# Patient Record
Sex: Female | Born: 1955 | Race: White | Hispanic: No
[De-identification: ages and names within clinical notes are randomized; demographics above are authoritative.]

---

## 2020-07-22 ENCOUNTER — Encounter

## 2020-07-22 DIAGNOSIS — R51 Headache: Secondary | ICD-10-CM

## 2020-07-22 DIAGNOSIS — M810 Age-related osteoporosis without current pathological fracture: Secondary | ICD-10-CM

## 2020-07-22 DIAGNOSIS — M069 Rheumatoid arthritis, unspecified: Secondary | ICD-10-CM

## 2020-07-22 DIAGNOSIS — M545 Low back pain: Secondary | ICD-10-CM

## 2022-04-01 IMAGING — MR MRI RIGHT TIB-FIB WITHOUT CONTRAST
4 of 6 series · 19 of 40 positions shown · IV contrast (gadolinium)
Comparison: 02/06/2022 [facility] radiographs

________________________________________________________________________________________________ 
MRI RIGHT TIB-FIB WITHOUT CONTRAST, 04/01/2022 [DATE]: 
CLINICAL INDICATION: Stress fracture, right tibia. Patient is a dancer.
TECHNIQUE: Multiplanar, multiecho position MR images of the right calf were 
performed without intravenous gadolinium enhancement. The left calf was included 
on coronal imaging. Patient was scanned on a 1.5T magnet.

[Series 301: survey right · axial · right · 10.0mm · 1.05mm/px · z∈[+5,+255]mm · 3 of 12 slices shown]
[im 1/12]
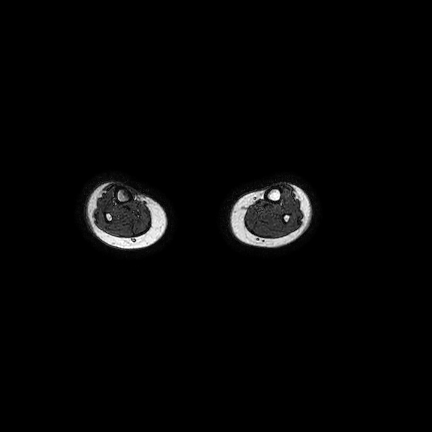
[im 6/12]
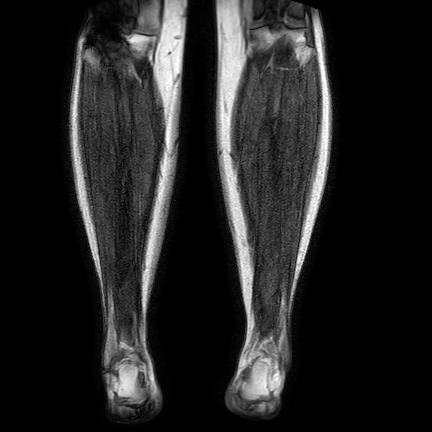
[im 12/12]
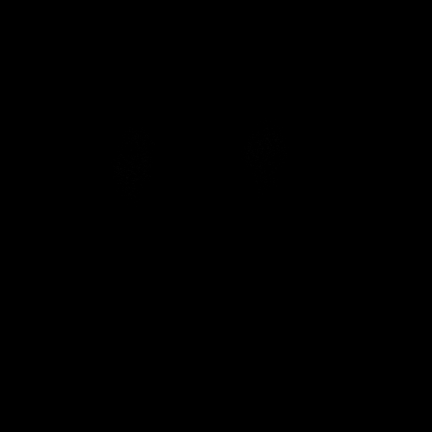

[Series 402: stir_cor bilat · coronal · right · 5.0mm · 0.81mm/px · 5 of 30 slices shown]
[im 1/30]
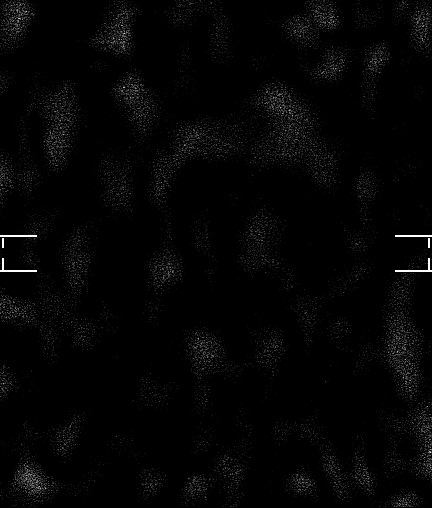
[im 8/30]
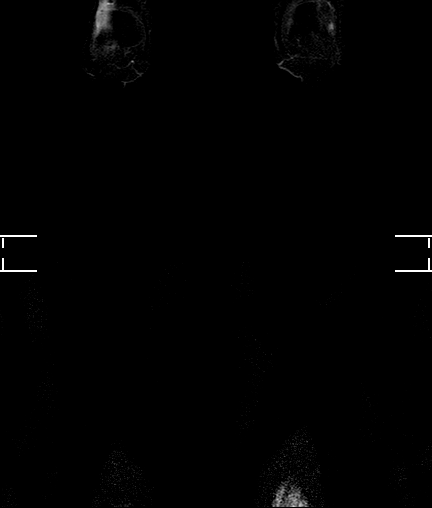
[im 15/30]
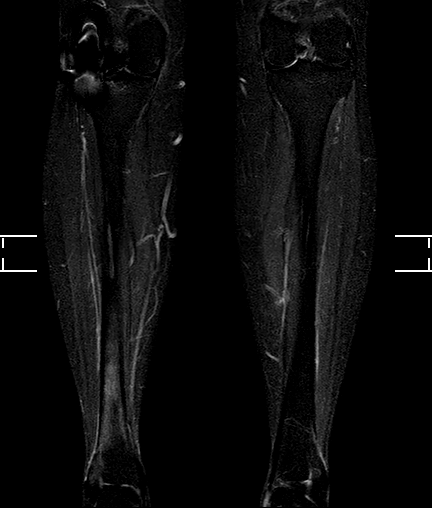
[im 22/30]
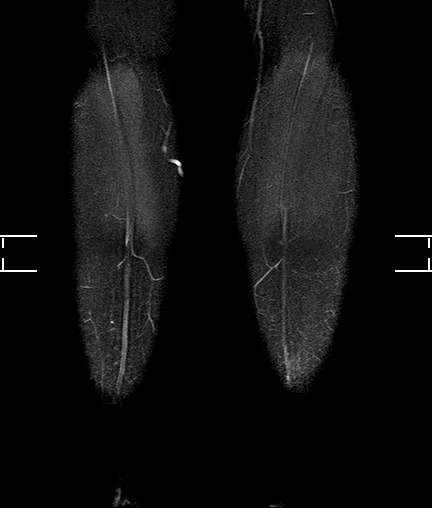
[im 30/30]
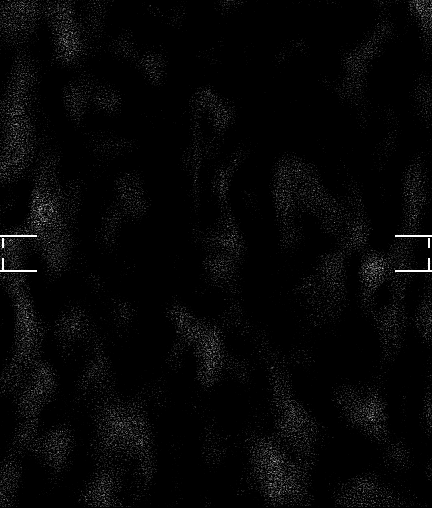

[Series 502: mobiview stir_sag · sagittal · right · 5.0mm · 0.51mm/px · 4 of 23 slices shown]
[im 1/23]
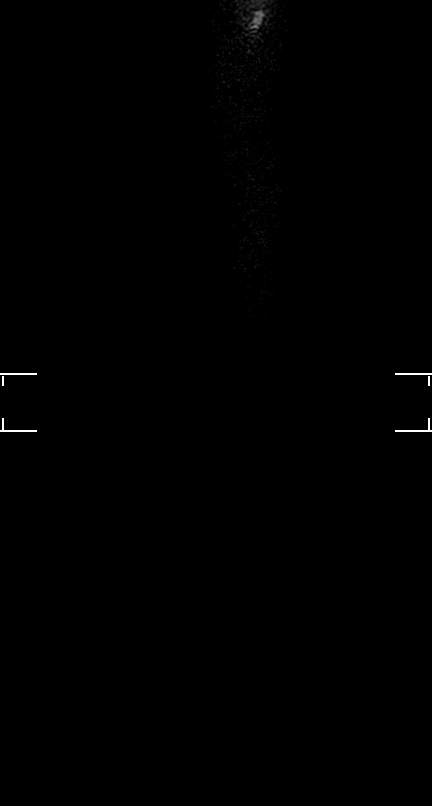
[im 8/23]
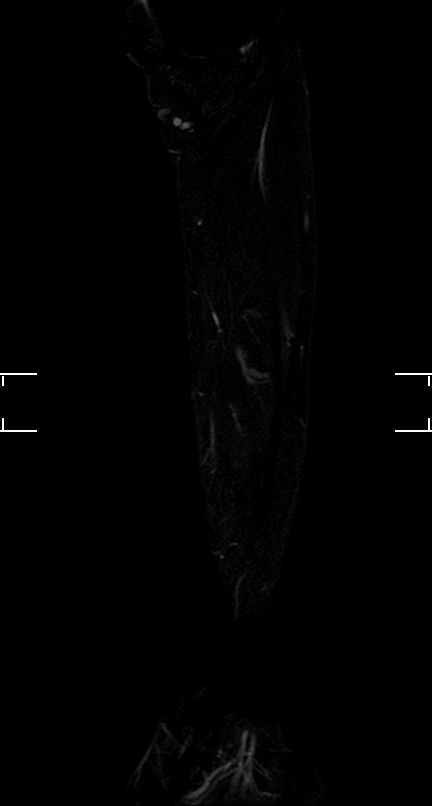
[im 15/23]
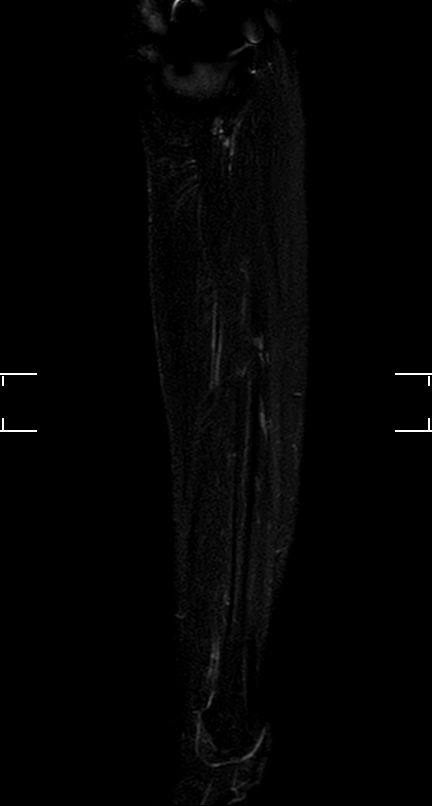
[im 23/23]
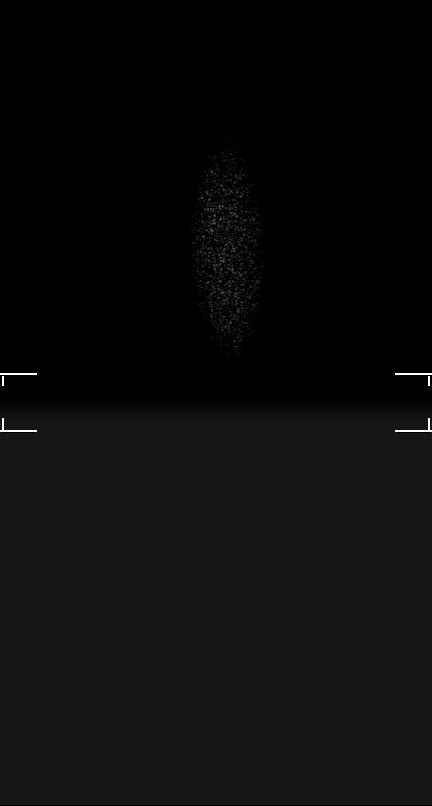

[Series 601: t1w_ax right · axial · right · 5.5mm · 0.49mm/px · z∈[-147,+175]mm · 7 of 52 slices shown]
[im 1/52]
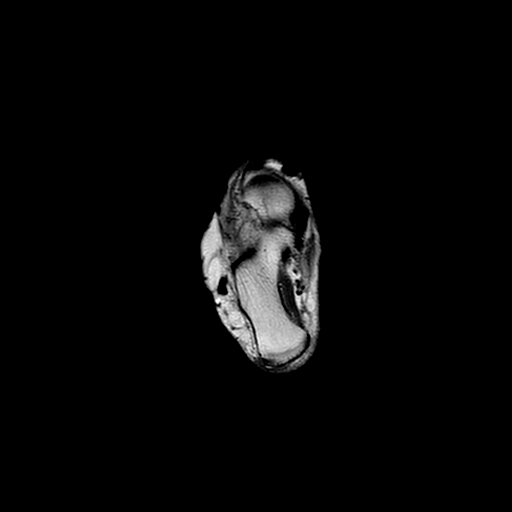
[im 8/52]
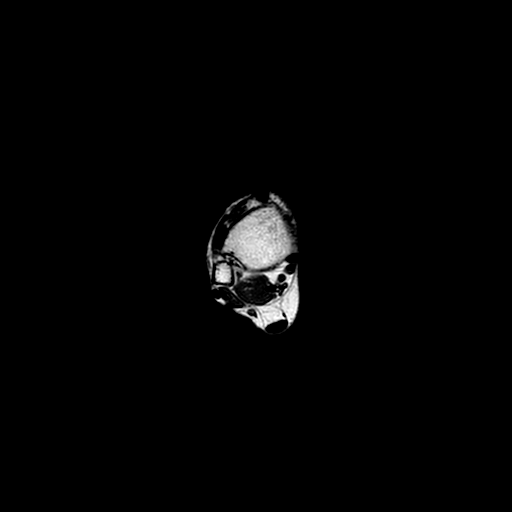
[im 15/52]
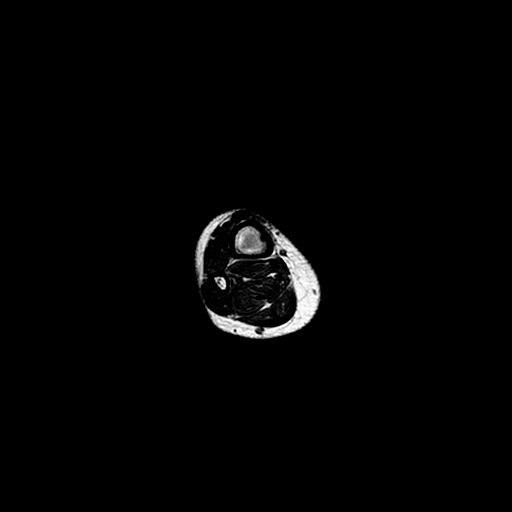
[im 22/52]
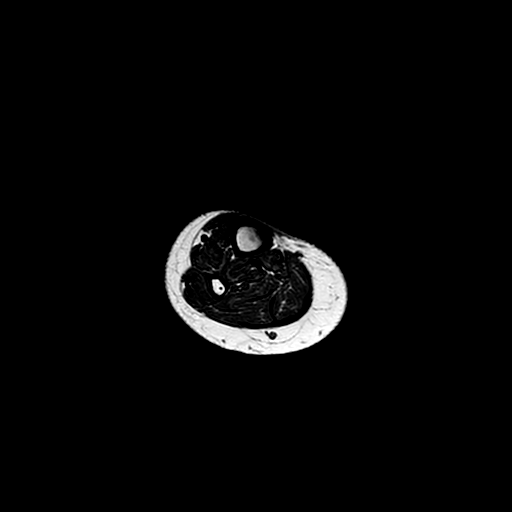
[im 30/52]
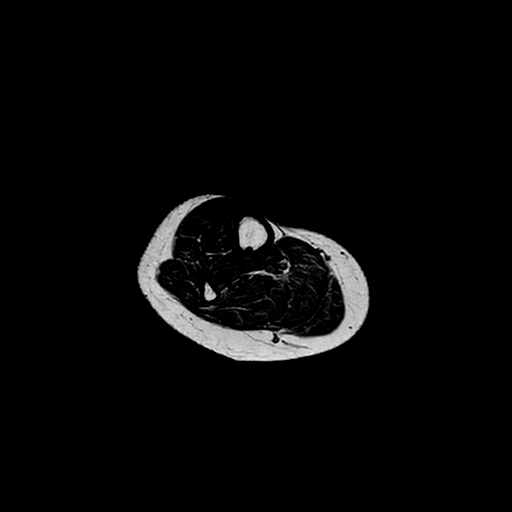
[im 37/52]
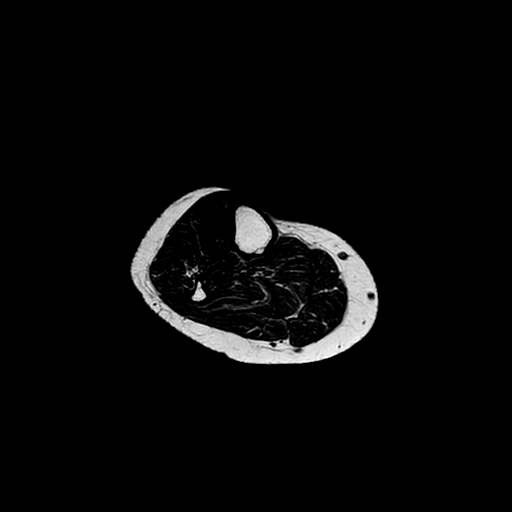
[im 44/52]
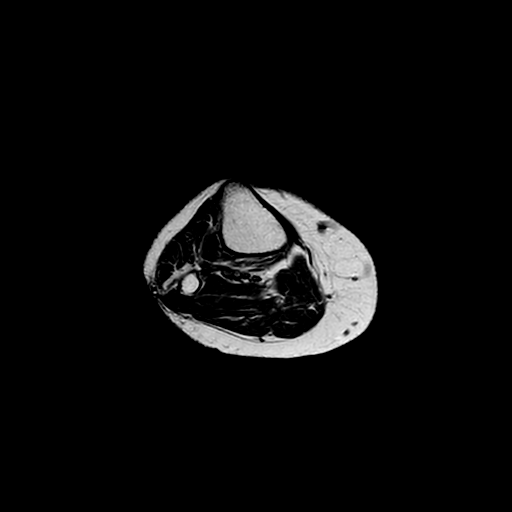

[19 of 40 positions shown; findings below may reference images not displayed]

FINDINGS: TIBIA/FIBULA: Subacute nondisplaced, incomplete right distal medial tibial 
stress fracture measures 6.7 cm in length, with the epicenter located 8.8 cm 
proximal to the tibial plafond. The fracture extends into the peripheral 
medullary canal and surrounding marrow edema measures 12.5 cm in length 
(Arismendy classification 4b). Medial/anteromedial distal tibial periosteal 
reaction. 
JOINTS: Right lateral knee compartment hemiarthroplasty. Ankle joint is 
preserved. No focal erosion. No subchondral marrow edema. Normal alignment. No 
joint effusion. No discrete synovitis.  
TENDONS: Intact. 
SOFT TISSUES: Distal right pretibial subcutaneous soft tissue swelling. Small 
superficial varicosities. Normal muscle signal intensity, without atrophy. No 
mass or fluid collections.
IMPRESSION: 1.  Subacute nondisplaced, incomplete right distal medial tibial stress fracture 
measures 6.7 cm in length (Arismendy classification 4b). 
2.  Right lateral knee compartment hemiarthroplasty.

## 2022-06-27 IMAGING — MR MRI RIGHT HIP WITHOUT CONTRAST
4 of 7 series · 16 of 40 positions shown · IV contrast (gadolinium)
Comparison: None.

________________________________________________________________________________________________ 
MRI RIGHT HIP WITHOUT CONTRAST, 06/27/2022 [DATE]: 
CLINICAL INDICATION: Unilateral primary osteoarthritis, right hip.
TECHNIQUE: Multiplanar, multiecho position MR images of the pelvis and right hip 
were performed without intravenous gadolinium enhancement. Small field-of-view 
imaging was performed of the hip.

[Series 101: survey_fullfov_transversal · axial · 10.0mm · 1.66mm/px · z∈[-51,+51]mm · 2 of 7 slices shown]
[im 1/7]
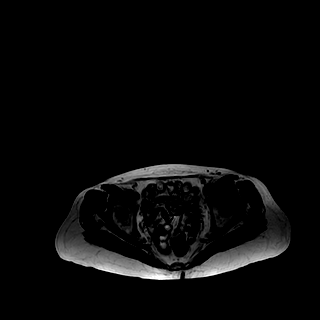
[im 7/7]
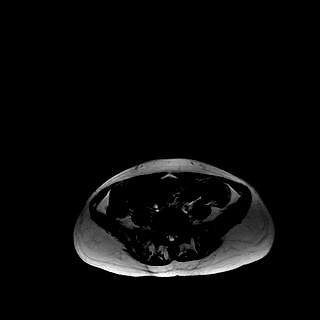

[Series 201: survey · axial · 15.0mm · 1.76mm/px · z∈[+20,+263]mm · 3 of 14 slices shown]
[im 1/14]
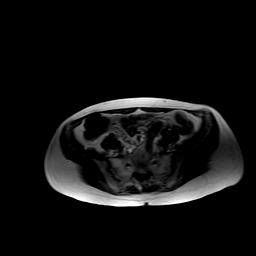
[im 7/14]
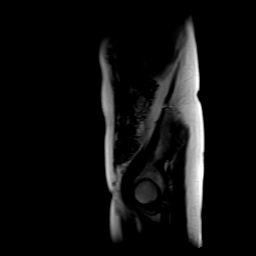
[im 14/14]
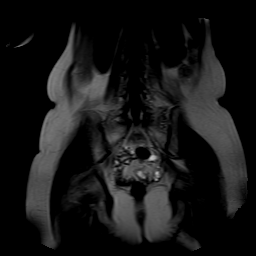

[Series 301: stir_cor-pelvis · coronal · 5.0mm · 0.73mm/px · 7 of 30 slices shown]
[im 1/30]
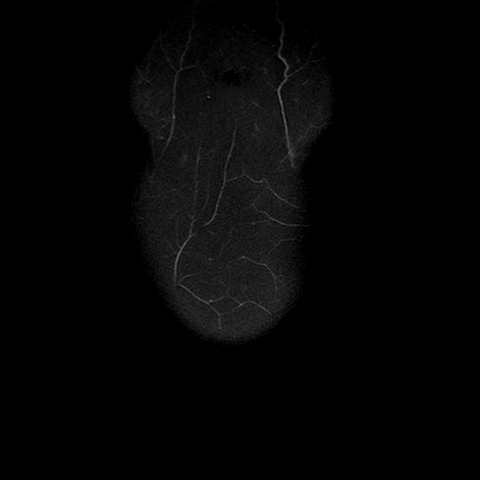
[im 5/30]
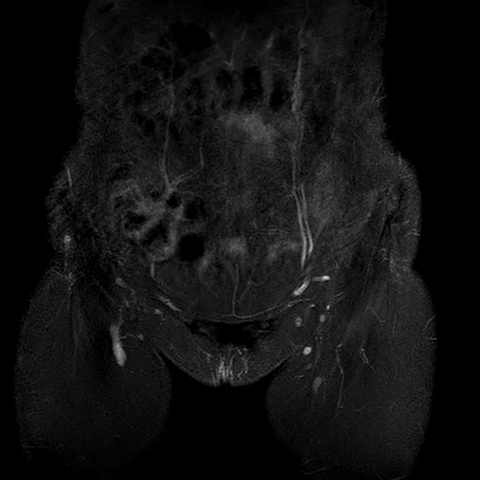
[im 10/30]
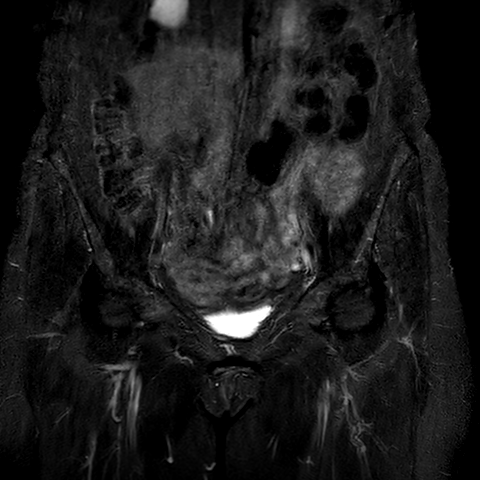
[im 15/30]
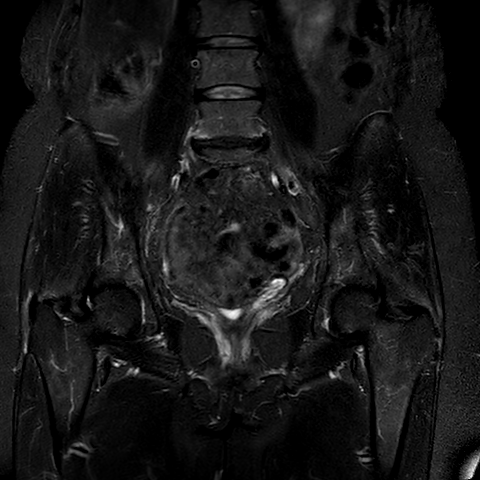
[im 20/30]
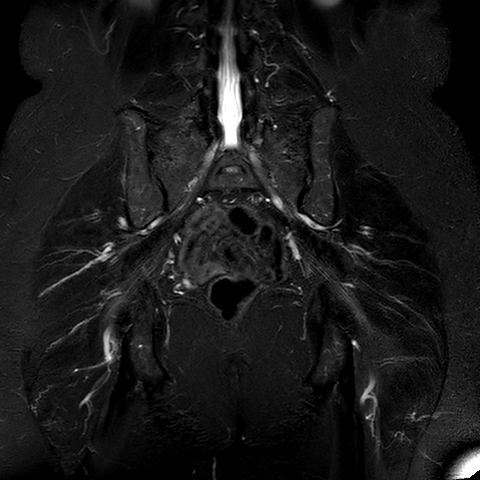
[im 25/30]
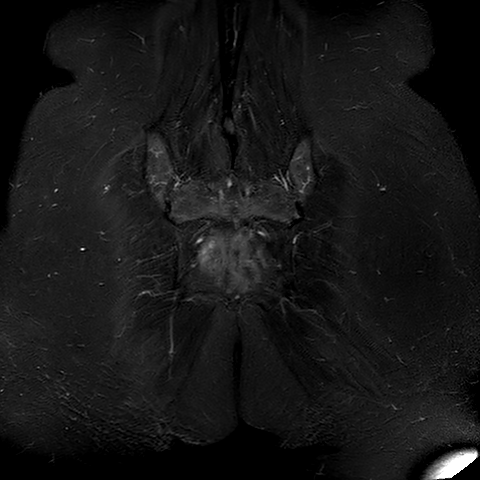
[im 30/30]
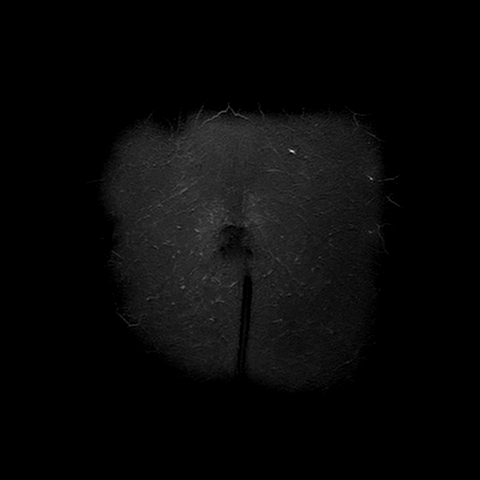

[Series 401: t1_(person_name) · axial · 5.0mm · 0.44mm/px · z∈[-161,+55]mm · 4 of 42 slices shown]
[im 1/42]
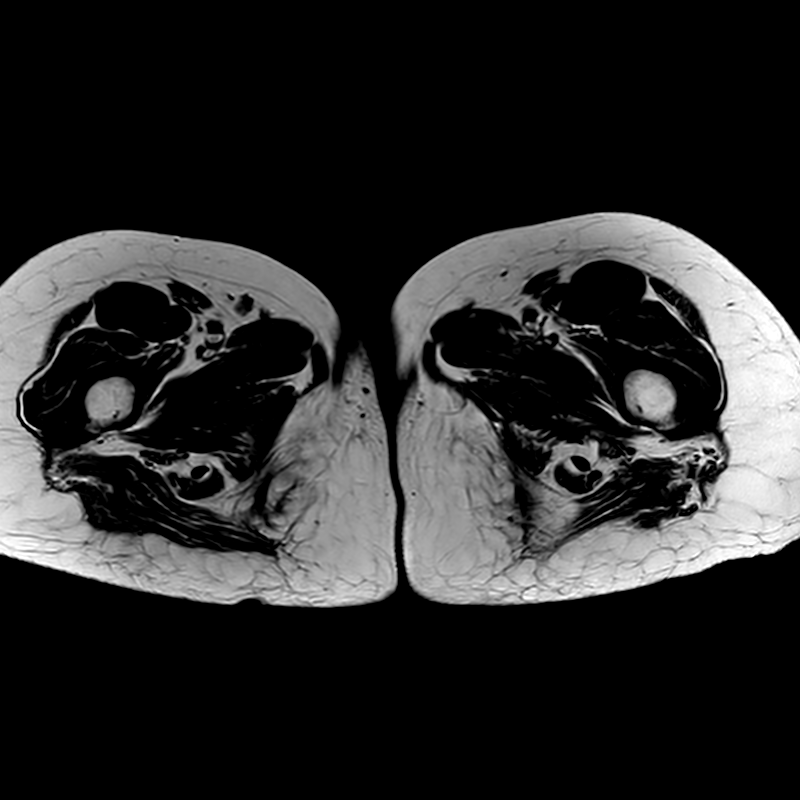
[im 5/42]
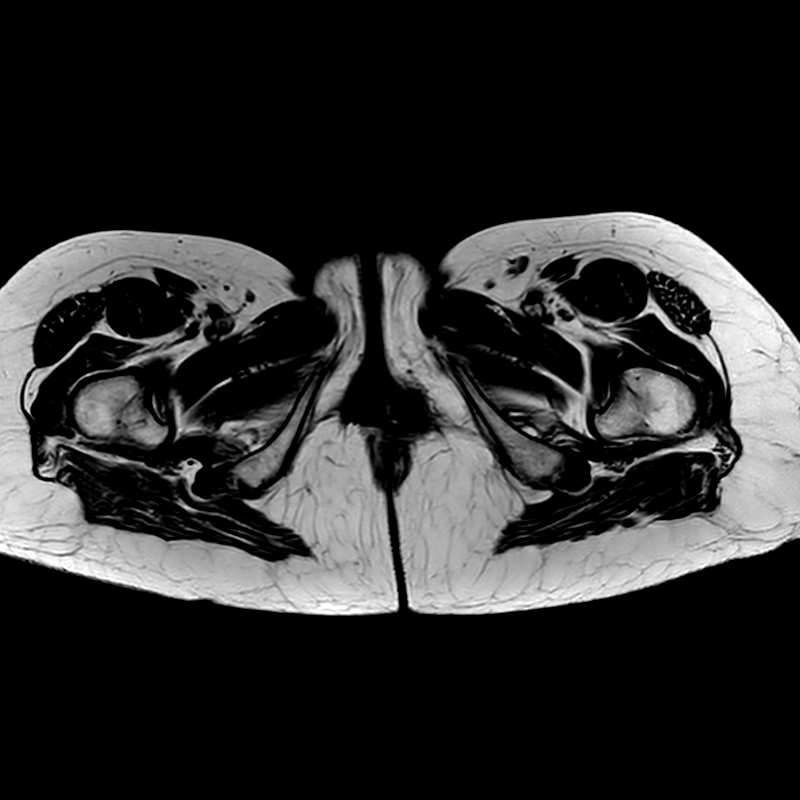
[im 23/42]
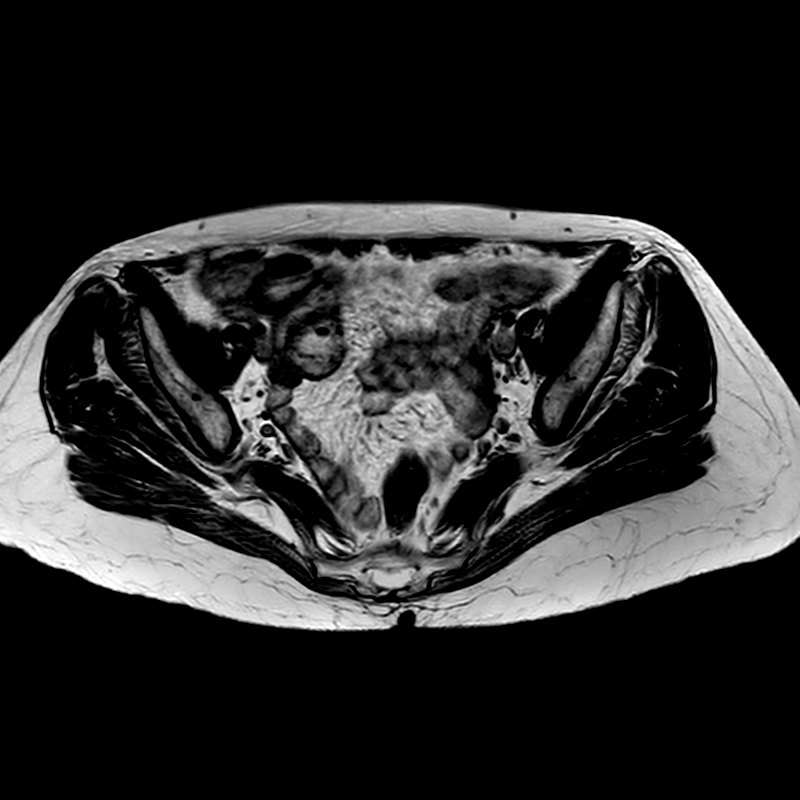
[im 37/42]
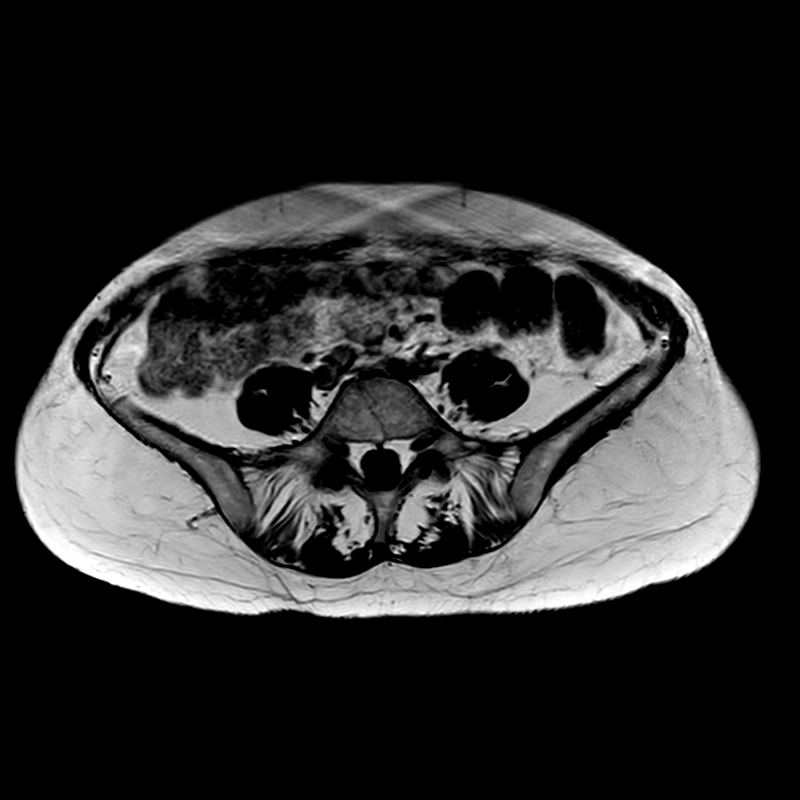

[16 of 40 positions shown; findings below may reference images not displayed]

FINDINGS: HIPS: Mild degenerative change of the hips with partial thickness chondromalacia 
and degenerative anterior labral tears. No paralabral cyst. No hip joint 
effusion. Both femoral heads maintain a spherical configuration without evidence 
of avascular necrosis or subarticular collapse. No abnormal morphology of the 
proximal femurs or acetabulum to predispose to impingement. 
PELVIC BONES: Normal marrow signal intensity. No fracture, contusion or marrow 
replacing lesion.  
SI JOINTS: SI joints are preserved.  
PUBIC SYMPHYSIS: Preserved. 
SPINE: Degenerative change, most marked at L5-S1. Tarlov cysts. 
SOFT TISSUES: Mild tendinosis of the bilateral distal gluteus minimus tendons 
with tendon thickening, intermediate signal and mild peritendinous edema. The 
abductor cuffs are otherwise preserved without high-grade interstitial tear. 
There is trace fluid overlying the greater trochanters without overt 
trochanteric bursitis. The origins of the hamstrings are intact. The rectus 
abdominis-adductor aponeurotic complexes are intact. No mass, free fluid or 
adenopathy. The bowel, bladder and pelvic organs are unremarkable.
IMPRESSION: 1.  Mild degenerative change of the hips and anterior labral tears.     
2.  Mild bilateral gluteal tendinosis. 
3.  Degenerative change of the spine, most marked at L5-S1.

## 2022-12-23 IMAGING — MR MRI LUMBAR SPINE WITHOUT CONTRAST
4 of 8 series · 6 of 48 positions shown · IV contrast (gadolinium)
Comparison: MRI right hip June 27, 2022.

________________________________________________________________________________________________ 
MRI LUMBAR SPINE WITHOUT CONTRAST, 12/23/2022 [DATE]: 
CLINICAL INDICATION: Low back pain that radiates to right lower extremity. No 
trauma. No surgical history. Pain is worsening.
TECHNIQUE: Multiplanar, multiecho position MR images of the lumbar spine were 
performed without intravenous gadolinium enhancement. Patient was scanned on a 
3T magnet

[Series 201: survey · axial · 10.0mm · 1.39mm/px · 1 of 9 slices shown]
[im 1/9]
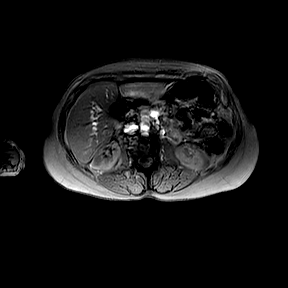

[Series 301: t2w_cor-surv · coronal · 6.0mm · 0.50mm/px · 1 of 5 slices shown]
[im 1/5]
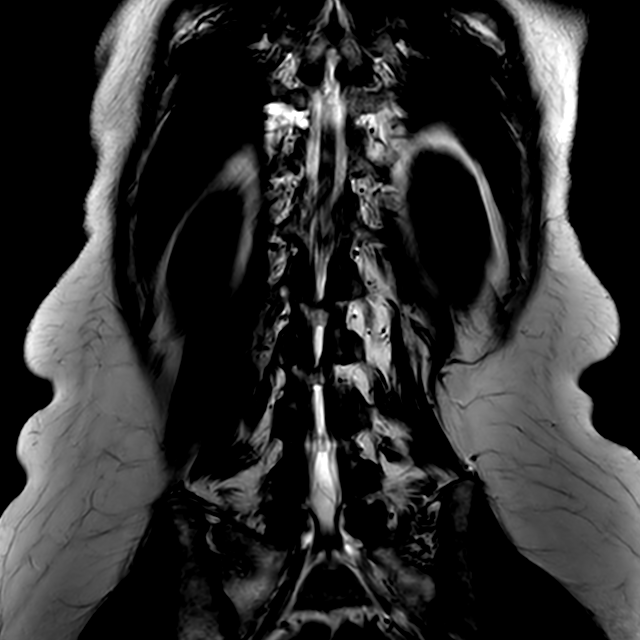

[Series 401: t1w_tse sag · sagittal · 4.0mm · 0.25mm/px · 2 of 16 slices shown]
[im 1/16]
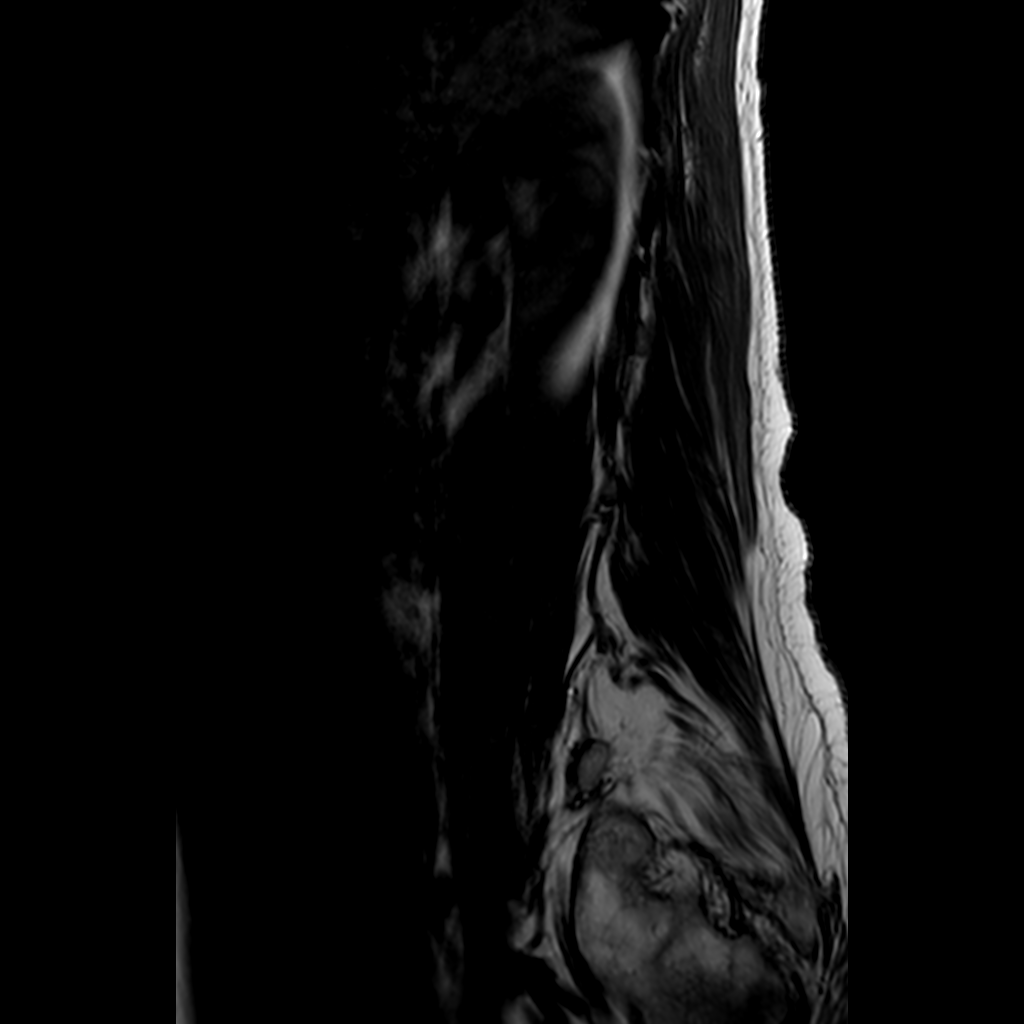
[im 16/16]
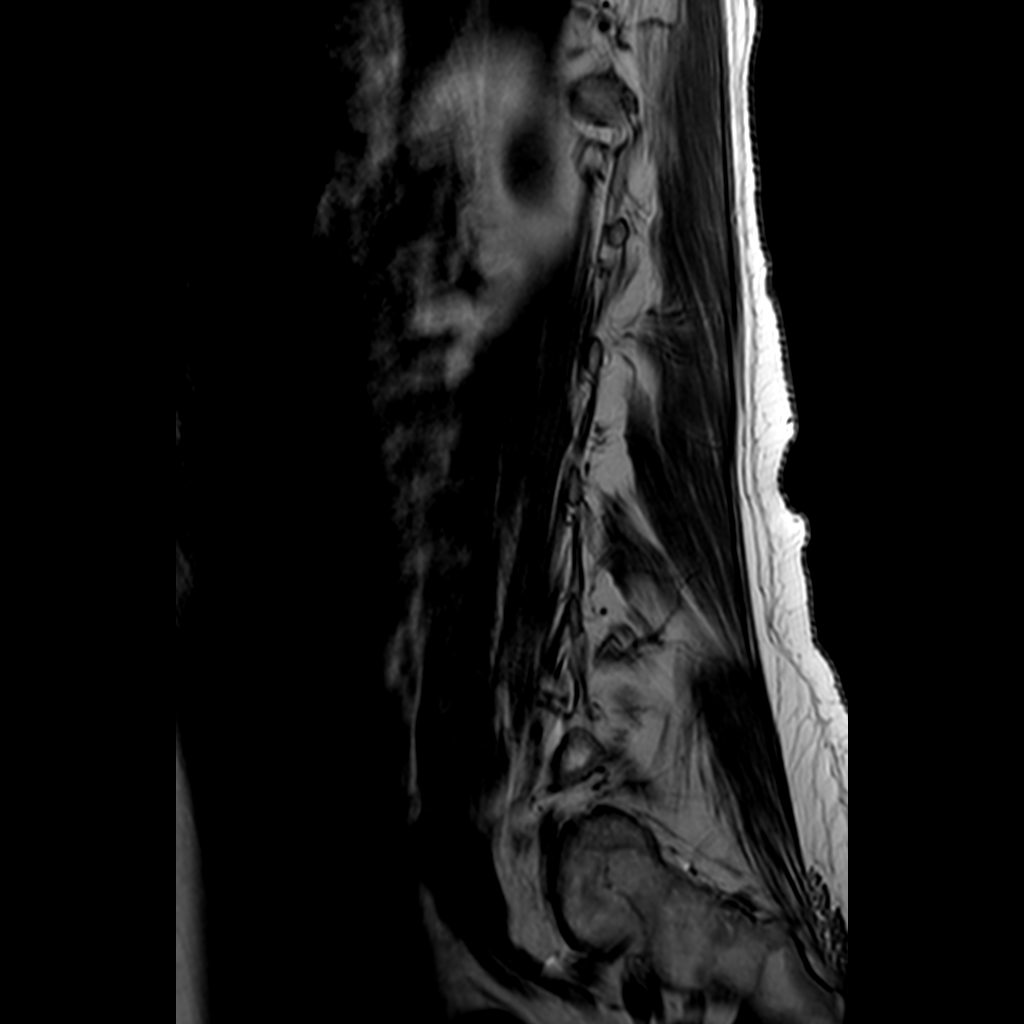

[Series 501: t2w_tse sag · sagittal · 4.0mm · 0.26mm/px · 2 of 16 slices shown]
[im 1/16]
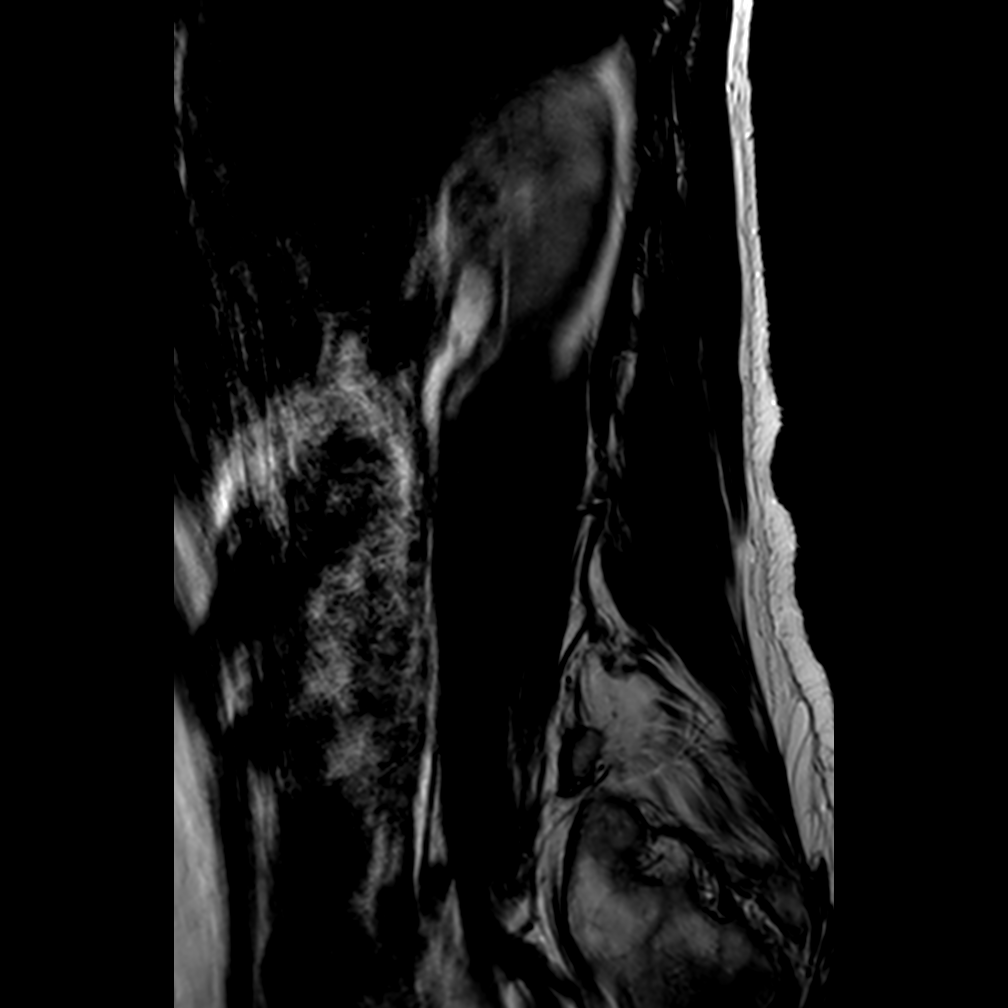
[im 8/16]
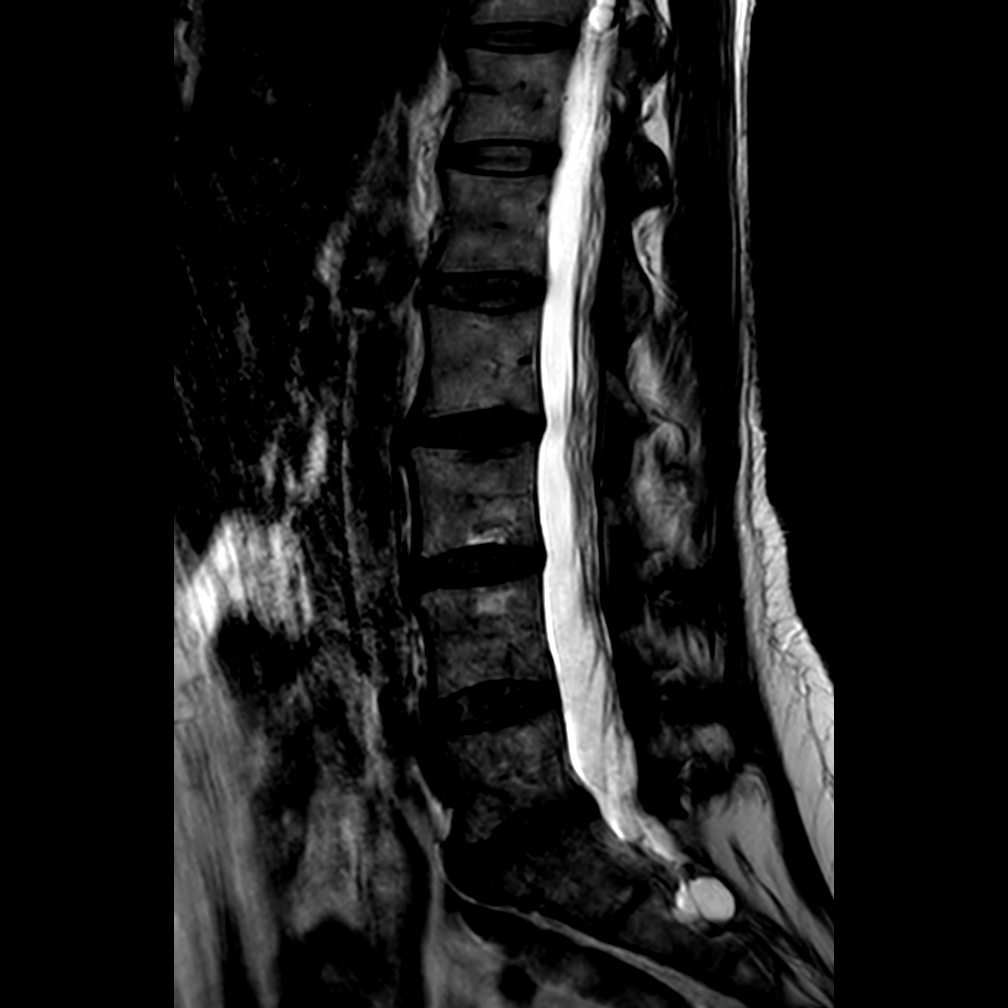

[6 of 48 positions shown; findings below may reference images not displayed]

FINDINGS: -------------------------------------------------------------------------------- 
------ 
GENERAL: 
Nomenclature is based on 5 lumbar type vertebral bodies.     
ALIGNMENT: Minimal dextroconvex lumbar scoliosis. Loss of the normal lumbar 
lordosis with mild grade 1 retrolisthesis L2 on L3 and L5 on S1. 
VERTEBRAL BODY HEIGHT: Normal.  
MARROW SIGNAL: No focal suspect signal abnormality. 
CORD SIGNAL: Normal distal spinal cord and cauda equina. Conus medullaris 
terminates at L2. 
ADDITIONAL FINDINGS: Sacral Tarlov cysts. Nerve root sleeve cysts are noted at 
numerous levels. 
Modic I-II: L5-S1, L3-L4 
Ligamentum Flavum > 2.5 mm: All levels. 
-------------------------------------------------------------------------------- 
------ 
SEGMENTAL: 
T12-L1: Loss of disc signal with minimal annular bulge. Otherwise normal. 
L1-L2: Loss of disc signal. Otherwise normal. 
L2-L3: Mild loss of disc height and signal with Schmorls node. Minimal annular 
bulge. Canal and foramina are patent with normal facets. 
L3-L4: Loss of disc height and signal. Minimal annular bulge. Canal and foramina 
are patent. Normal facets. 
L4-L5: Loss of disc signal. Canal and foramina are patent. Normal facets. 
L5-S1: Mild loss of disc height. Loss of disc signal. Minimal annular bulge. 
Small size central disc extrusion measures 2 mm in AP dimension with a small 
amount of disc material extending inferiorly. Canal and lateral recesses are 
patent. Small bilateral facet joint effusions. Mild bilateral foraminal 
narrowing. 
-------------------------------------------------------------------------------- 
------
IMPRESSION: Mild degenerative and scoliotic changes. No significant canal or foraminal 
stenosis with details as above.
